# Patient Record
Sex: Male | Born: 1985 | Race: White | Hispanic: No | Marital: Single
Health system: Southern US, Community
[De-identification: ages and names within clinical notes are randomized; demographics above are authoritative.]

## PROBLEM LIST (undated history)

## (undated) DIAGNOSIS — I471 Supraventricular tachycardia: Secondary | ICD-10-CM

## (undated) HISTORY — DX: Supraventricular tachycardia: I47.1

---

## 2020-06-27 ENCOUNTER — Encounter (HOSPITAL_COMMUNITY): Payer: Self-pay | Admitting: Emergency Medicine

## 2020-06-27 ENCOUNTER — Emergency Department (HOSPITAL_COMMUNITY)
Admission: EM | Admit: 2020-06-27 | Discharge: 2020-06-28 | Disposition: A | Discharge status: Home / Self Care | Payer: No Typology Code available for payment source | Attending: Emergency Medicine | Admitting: Emergency Medicine

## 2020-06-27 ENCOUNTER — Emergency Department (HOSPITAL_COMMUNITY): Payer: No Typology Code available for payment source

## 2020-06-27 ENCOUNTER — Other Ambulatory Visit: Payer: Self-pay

## 2020-06-27 DIAGNOSIS — S2241XA Multiple fractures of ribs, right side, initial encounter for closed fracture: Secondary | ICD-10-CM | POA: Insufficient documentation

## 2020-06-27 DIAGNOSIS — S299XXA Unspecified injury of thorax, initial encounter: Secondary | ICD-10-CM | POA: Diagnosis present

## 2020-06-27 DIAGNOSIS — M79602 Pain in left arm: Secondary | ICD-10-CM | POA: Insufficient documentation

## 2020-06-27 DIAGNOSIS — Y92410 Unspecified street and highway as the place of occurrence of the external cause: Secondary | ICD-10-CM | POA: Insufficient documentation
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
## 2020-06-28 MED ORDER — OXYCODONE-ACETAMINOPHEN 5-325 MG PO TABS
2.0000 | ORAL_TABLET | Freq: Once | ORAL | Status: AC
Start: 2020-06-28 — End: 2020-06-28
  Administered 2020-06-28: 2 via ORAL
  Filled 2020-06-28: qty 2

## 2020-06-28 MED ORDER — OXYCODONE-ACETAMINOPHEN 5-325 MG PO TABS: 1.0000 | ORAL_TABLET | Freq: Four times a day (QID) | ORAL | 0 refills | Status: AC | PRN
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
